# Patient Record
Sex: Male | Born: 1995 | Race: White | Hispanic: No | Marital: Single | Smoking: Current every day smoker
Health system: Southern US, Community
[De-identification: ages and names within clinical notes are randomized; demographics above are authoritative.]

---
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
## 2018-11-26 ENCOUNTER — Other Ambulatory Visit: Payer: Self-pay

## 2018-11-26 ENCOUNTER — Encounter: Payer: Self-pay | Admitting: Emergency Medicine

## 2018-11-26 ENCOUNTER — Ambulatory Visit
Admission: EM | Admit: 2018-11-26 | Discharge: 2018-11-26 | Disposition: A | Discharge status: Home / Self Care | Payer: BLUE CROSS/BLUE SHIELD | Attending: Internal Medicine | Admitting: Internal Medicine

## 2018-11-26 DIAGNOSIS — S20212A Contusion of left front wall of thorax, initial encounter: Secondary | ICD-10-CM

## 2018-11-26 DIAGNOSIS — T07XXXA Unspecified multiple injuries, initial encounter: Secondary | ICD-10-CM

## 2018-11-26 MED ORDER — IBUPROFEN 400 MG PO TABS: 800.0000 mg | ORAL_TABLET | Freq: Three times a day (TID) | ORAL | Status: AC | PRN
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
## 2021-09-22 DIAGNOSIS — F411 Generalized anxiety disorder: Secondary | ICD-10-CM | POA: Diagnosis not present

## 2021-09-28 DIAGNOSIS — F411 Generalized anxiety disorder: Secondary | ICD-10-CM | POA: Diagnosis not present

## 2021-10-05 DIAGNOSIS — F411 Generalized anxiety disorder: Secondary | ICD-10-CM | POA: Diagnosis not present

## 2021-10-12 DIAGNOSIS — F411 Generalized anxiety disorder: Secondary | ICD-10-CM | POA: Diagnosis not present

## 2021-10-19 DIAGNOSIS — F411 Generalized anxiety disorder: Secondary | ICD-10-CM | POA: Diagnosis not present

## 2021-10-26 DIAGNOSIS — F411 Generalized anxiety disorder: Secondary | ICD-10-CM | POA: Diagnosis not present

## 2021-11-10 DIAGNOSIS — F411 Generalized anxiety disorder: Secondary | ICD-10-CM | POA: Diagnosis not present

## 2021-11-23 DIAGNOSIS — F411 Generalized anxiety disorder: Secondary | ICD-10-CM | POA: Diagnosis not present

## 2021-11-30 DIAGNOSIS — F411 Generalized anxiety disorder: Secondary | ICD-10-CM | POA: Diagnosis not present

## 2021-12-07 DIAGNOSIS — F411 Generalized anxiety disorder: Secondary | ICD-10-CM | POA: Diagnosis not present

## 2021-12-14 DIAGNOSIS — F411 Generalized anxiety disorder: Secondary | ICD-10-CM | POA: Diagnosis not present

## 2021-12-21 DIAGNOSIS — F411 Generalized anxiety disorder: Secondary | ICD-10-CM | POA: Diagnosis not present

## 2021-12-28 DIAGNOSIS — F411 Generalized anxiety disorder: Secondary | ICD-10-CM | POA: Diagnosis not present

## 2022-01-04 DIAGNOSIS — F411 Generalized anxiety disorder: Secondary | ICD-10-CM | POA: Diagnosis not present

## 2022-01-11 DIAGNOSIS — F411 Generalized anxiety disorder: Secondary | ICD-10-CM | POA: Diagnosis not present

## 2022-01-18 DIAGNOSIS — F411 Generalized anxiety disorder: Secondary | ICD-10-CM | POA: Diagnosis not present

## 2022-02-01 DIAGNOSIS — F411 Generalized anxiety disorder: Secondary | ICD-10-CM | POA: Diagnosis not present

## 2022-02-08 DIAGNOSIS — F411 Generalized anxiety disorder: Secondary | ICD-10-CM | POA: Diagnosis not present

## 2022-02-15 DIAGNOSIS — F411 Generalized anxiety disorder: Secondary | ICD-10-CM | POA: Diagnosis not present

## 2022-02-22 DIAGNOSIS — F411 Generalized anxiety disorder: Secondary | ICD-10-CM | POA: Diagnosis not present

## 2022-03-01 DIAGNOSIS — F411 Generalized anxiety disorder: Secondary | ICD-10-CM | POA: Diagnosis not present

## 2022-03-08 DIAGNOSIS — F411 Generalized anxiety disorder: Secondary | ICD-10-CM | POA: Diagnosis not present

## 2022-03-15 DIAGNOSIS — F411 Generalized anxiety disorder: Secondary | ICD-10-CM | POA: Diagnosis not present

## 2022-03-22 DIAGNOSIS — F411 Generalized anxiety disorder: Secondary | ICD-10-CM | POA: Diagnosis not present

## 2022-03-29 DIAGNOSIS — F411 Generalized anxiety disorder: Secondary | ICD-10-CM | POA: Diagnosis not present

## 2022-04-05 DIAGNOSIS — F411 Generalized anxiety disorder: Secondary | ICD-10-CM | POA: Diagnosis not present

## 2022-04-12 DIAGNOSIS — F411 Generalized anxiety disorder: Secondary | ICD-10-CM | POA: Diagnosis not present

## 2022-04-19 DIAGNOSIS — F411 Generalized anxiety disorder: Secondary | ICD-10-CM | POA: Diagnosis not present

## 2022-04-26 DIAGNOSIS — F411 Generalized anxiety disorder: Secondary | ICD-10-CM | POA: Diagnosis not present

## 2022-05-03 DIAGNOSIS — F411 Generalized anxiety disorder: Secondary | ICD-10-CM | POA: Diagnosis not present

## 2022-05-11 DIAGNOSIS — F411 Generalized anxiety disorder: Secondary | ICD-10-CM | POA: Diagnosis not present

## 2022-05-17 DIAGNOSIS — F411 Generalized anxiety disorder: Secondary | ICD-10-CM | POA: Diagnosis not present

## 2022-05-24 DIAGNOSIS — F411 Generalized anxiety disorder: Secondary | ICD-10-CM | POA: Diagnosis not present

## 2022-05-31 DIAGNOSIS — F411 Generalized anxiety disorder: Secondary | ICD-10-CM | POA: Diagnosis not present

## 2022-06-07 DIAGNOSIS — F411 Generalized anxiety disorder: Secondary | ICD-10-CM | POA: Diagnosis not present

## 2022-06-14 DIAGNOSIS — F411 Generalized anxiety disorder: Secondary | ICD-10-CM | POA: Diagnosis not present

## 2022-06-21 DIAGNOSIS — F411 Generalized anxiety disorder: Secondary | ICD-10-CM | POA: Diagnosis not present

## 2022-06-28 DIAGNOSIS — F411 Generalized anxiety disorder: Secondary | ICD-10-CM | POA: Diagnosis not present

## 2022-07-05 DIAGNOSIS — F411 Generalized anxiety disorder: Secondary | ICD-10-CM | POA: Diagnosis not present

## 2022-07-12 DIAGNOSIS — F411 Generalized anxiety disorder: Secondary | ICD-10-CM | POA: Diagnosis not present

## 2022-07-19 DIAGNOSIS — F411 Generalized anxiety disorder: Secondary | ICD-10-CM | POA: Diagnosis not present

## 2022-07-26 DIAGNOSIS — F411 Generalized anxiety disorder: Secondary | ICD-10-CM | POA: Diagnosis not present

## 2022-08-02 DIAGNOSIS — F411 Generalized anxiety disorder: Secondary | ICD-10-CM | POA: Diagnosis not present

## 2022-08-09 DIAGNOSIS — F411 Generalized anxiety disorder: Secondary | ICD-10-CM | POA: Diagnosis not present

## 2022-08-16 DIAGNOSIS — F411 Generalized anxiety disorder: Secondary | ICD-10-CM | POA: Diagnosis not present

## 2022-08-23 DIAGNOSIS — F411 Generalized anxiety disorder: Secondary | ICD-10-CM | POA: Diagnosis not present

## 2022-08-30 DIAGNOSIS — F411 Generalized anxiety disorder: Secondary | ICD-10-CM | POA: Diagnosis not present

## 2022-09-08 ENCOUNTER — Encounter: Payer: Self-pay | Admitting: Nurse Practitioner

## 2022-09-08 ENCOUNTER — Ambulatory Visit: Payer: BC Managed Care – PPO | Admitting: Nurse Practitioner

## 2022-09-08 VITALS — BP 140/80 | HR 73 | Temp 98.1°F | Ht 66.0 in | Wt 130.4 lb

## 2022-09-08 DIAGNOSIS — Z23 Encounter for immunization: Secondary | ICD-10-CM

## 2022-09-08 DIAGNOSIS — Z0000 Encounter for general adult medical examination without abnormal findings: Secondary | ICD-10-CM

## 2022-09-08 DIAGNOSIS — Z13220 Encounter for screening for lipoid disorders: Secondary | ICD-10-CM

## 2022-09-08 DIAGNOSIS — K1379 Other lesions of oral mucosa: Secondary | ICD-10-CM | POA: Diagnosis not present

## 2022-09-08 LAB — CBC
HCT: 43.8 % (ref 39.0–52.0)
Hemoglobin: 14.6 g/dL (ref 13.0–17.0)
MCHC: 33.3 g/dL (ref 30.0–36.0)
MCV: 85.4 fl (ref 78.0–100.0)
Platelets: 235 10*3/uL (ref 150.0–400.0)
RBC: 5.12 Mil/uL (ref 4.22–5.81)
RDW: 12.8 % (ref 11.5–15.5)
WBC: 5.2 10*3/uL (ref 4.0–10.5)

## 2022-09-08 LAB — COMPREHENSIVE METABOLIC PANEL
ALT: 13 U/L (ref 0–53)
AST: 14 U/L (ref 0–37)
Albumin: 4.9 g/dL (ref 3.5–5.2)
Alkaline Phosphatase: 61 U/L (ref 39–117)
BUN: 15 mg/dL (ref 6–23)
CO2: 28 mEq/L (ref 19–32)
Calcium: 9.9 mg/dL (ref 8.4–10.5)
Chloride: 105 mEq/L (ref 96–112)
Creatinine, Ser: 0.93 mg/dL (ref 0.40–1.50)
GFR: 114.24 mL/min (ref >60.00)
Glucose, Bld: 102 mg/dL — ABNORMAL HIGH (ref 70–99)
Potassium: 4.2 mEq/L (ref 3.5–5.1)
Sodium: 141 mEq/L (ref 135–145)
Total Bilirubin: 0.8 mg/dL (ref 0.2–1.2)
Total Protein: 7.4 g/dL (ref 6.0–8.3)

## 2022-09-08 LAB — LIPID PANEL
Cholesterol: 124 mg/dL (ref 0–200)
HDL: 55.6 mg/dL (ref >39.00)
LDL Cholesterol: 58 mg/dL (ref 0–99)
NonHDL: 68.42
Total CHOL/HDL Ratio: 2
Triglycerides: 54 mg/dL (ref 0.0–149.0)
VLDL: 10.8 mg/dL (ref 0.0–40.0)

## 2022-09-08 LAB — TSH: TSH: 1.02 u[IU]/mL (ref 0.35–5.50)

## 2022-09-08 LAB — HEMOGLOBIN A1C: Hgb A1c MFr Bld: 5.4 % (ref 4.6–6.5)

## 2022-09-08 MED ORDER — NYSTATIN 100000 UNIT/ML MT SUSP: 5.0000 mL | Freq: Four times a day (QID) | OROMUCOSAL | 0 refills | Status: AC | PRN

## 2022-09-08 NOTE — Progress Notes (Signed)
New Patient Office Visit  Subjective    Patient ID: Gary Daugherty, male    DOB: 12-06-1995  Age: 27 y.o. MRN: [id number]  CC:  Chief Complaint  Patient presents with   Establish Care    Pt states that he has painful mouth ulcers that do not heal normally. Pt states it causes him to not be able to eat comfortably.  Lasts for weeks.     HPI [name] presents to establish care   Mouth ulcers: states that he has noticed it since college, over the past 4-5 years. States that he will get 4 at a time. States front of the inside of the lip or cheeks. States that he does not get them on the tongue. States they last 3 weeks up to 2 months. States that he use a oral numbing agent. States that he does have soe stress. Can hurt to breath or with the just being there.  for complete physical and follow up of chronic conditions.  Immunizations: -Tetanus: update today -Influenza: Completed this season -Shingles:  too young -Pneumonia: too young  -HPV UTd Covid 4 doses   Diet: Fair diet. States that he does 3 meals a day and some snacks. Water and hot tea Exercise: Hobbies   Eye exam: PRN Dental exam: Completes semi-annually    Colonoscopy: Too young, currently average risk Lung Cancer Screening: N/A  PSA: Too young, currently average risk   Sleep: states 10-130 and get up 530-545. States feels rested. Does not snore  Patient declined STI testing as he states he is not sexually active currently      Outpatient Encounter Medications as of 09/08/2022  Medication Sig   ibuprofen (ADVIL) 400 MG tablet Take 2 tablets (800 mg total) by mouth every 8 (eight) hours as needed.   magic mouthwash (nystatin, lidocaine, diphenhydrAMINE, alum & mag hydroxide) suspension Swish and spit 5 mLs 4 (four) times daily as needed for mouth pain.   Pediatric Multivit-Minerals-C (KIDS GUMMY BEAR VITAMINS PO) Take 1 tablet by mouth daily at 12 noon. (Patient not taking: Reported on 09/08/2022)    [DISCONTINUED] promethazine (PHENERGAN) 12.5 MG tablet Take 1 tablet (12.5 mg total) by mouth every 4 (four) hours as needed for nausea or vomiting.   No facility-administered encounter medications on file as of 09/08/2022.    No past medical history on file.  Past Surgical History:  Procedure Laterality Date   APPENDECTOMY  2013   KNEE ARTHROSCOPY WITH ANTERIOR CRUCIATE LIGAMENT (ACL) REPAIR WITH HAMSTRING GRAFT Right 02/04/2015   Procedure: KNEE ARTHROSCOPY WITH ANTERIOR CRUCIATE LIGAMENT (ACL) REconstruction WITH HAMSTRING GRAFT;  Surgeon: Juanell Fairly, MD;  Location: [facility];  Service: Orthopedics;  Laterality: Right;    Family History  Problem Relation Age of Onset   Hypertension Mother     Social History   Socioeconomic History   Marital status: Single    Spouse name: Not on file   Number of children: 0   Years of education: Not on file   Highest education level: Not on file  Occupational History   Not on file  Tobacco Use   Smoking status: Every Day    Types: E-cigarettes   Smokeless tobacco: Never  Vaping Use   Vaping status: Every Day   Substances: Nicotine, Flavoring  Substance and Sexual Activity   Alcohol use: Not Currently    Comment: 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  Respiratory:  Negative for shortness of breath.   Cardiovascular:  Negative for chest pain and leg swelling.   Gastrointestinal:  Negative for abdominal pain, blood in stool, constipation, diarrhea, nausea and vomiting.       BM daily  Genitourinary:  Negative for dysuria and hematuria.  Neurological:  Negative for tingling and headaches.  Psychiatric/Behavioral:  Negative for hallucinations and suicidal ideas.         Objective    BP (!) 140/80   Pulse 73   Temp 98.1 F (36.7 C) (Temporal)   Ht 5\' 6"  (1.676 m)   Wt 130 lb 6.4 oz (59.1 kg)   SpO2 98%   BMI 21.05 kg/m   Physical Exam Vitals and nursing note reviewed. Exam conducted with a chaperone present Melina Copa, CMA).  Constitutional:      Appearance: Normal appearance.  HENT:     Right Ear: Tympanic membrane, ear canal and external ear normal.     Left Ear: Tympanic membrane, ear canal and external ear normal.     Mouth/Throat:     Mouth: Mucous membranes are moist.     Pharynx: Oropharynx is clear.   Eyes:     Extraocular Movements: Extraocular movements intact.     Pupils: Pupils are equal, round, and reactive to light.  Cardiovascular:     Rate and Rhythm: Normal rate and regular rhythm.     Pulses: Normal pulses.     Heart sounds: Normal heart sounds.  Pulmonary:     Effort: Pulmonary effort is normal.     Breath sounds: Normal breath sounds.  Abdominal:     General: Bowel sounds are normal. There is no distension.     Palpations: There is no mass.     Tenderness: There is no abdominal tenderness.     Hernia: No hernia is present. There is no hernia in the left inguinal area.  Genitourinary:    Penis: Normal.      Testes: Normal.     Epididymis:     Right: Normal.     Left: Normal.  Musculoskeletal:     Right lower leg: No edema.     Left lower leg: No edema.  Lymphadenopathy:     Cervical: No cervical adenopathy.     Lower Body: No right inguinal adenopathy. No left inguinal adenopathy.  Skin:    General: Skin is warm.  Neurological:     General: No focal deficit present.     Mental Status: He  is alert.     Deep Tendon Reflexes:     Reflex Scores:      Bicep reflexes are 2+ on the right side and 2+ on the left side.      Patellar reflexes are 2+ on the right side and 2+ on the left side.    Comments: Bilateral upper and lower extremity strength 5/5  Psychiatric:        Mood and  Affect: Mood normal.        Behavior: Behavior normal.        Thought Content: Thought content normal.        Judgment: Judgment normal.         Assessment & Plan:   Problem List Items Addressed This Visit       Digestive   Mouth sore    Recurrent mouth ulcers/sores.  Been going on for years.  Will check HIV status along with diabetes status to make sure patient is not experiencing immunocompromising condition will treat with Magic mouthwash 5 mL 4 times daily as needed if this is ineffective consider triamcinolone paste       Relevant Medications   magic mouthwash (nystatin, lidocaine, diphenhydrAMINE, alum & mag hydroxide) suspension   Other Relevant Orders   HIV Antibody (routine testing w rflx)   Hemoglobin A1c     Other   Preventative health care - Primary    Discussed age-appropriate immunizations and screening exams.  Did review patient's personal, surgical, social, family histories.  Patient up-to-date on all age-appropriate vaccinations.  Update tetanus vaccine today.  Patient is too young for CRC screening or prostate cancer screening.  Patient was given information at discharge about preventative healthcare maintenance with anticipatory guidance.      Relevant Orders   CBC   Comprehensive metabolic panel   TSH   Other Visit Diagnoses     Screening for lipid disorders       Relevant Orders   Lipid panel   Need for diphtheria-tetanus-pertussis (Tdap) vaccine       Relevant Orders   Tdap vaccine greater than or equal to 7yo IM       Return in about 1 year (around 09/08/2023).   Audria Nine, NP

## 2022-09-08 NOTE — Patient Instructions (Signed)
Nice to see you today I will be in touch with the labs once I have them Follow up with me in 1 year, sooner if you need me If the sores do not heal with the mouth wash let me know and we can try the paste

## 2022-09-08 NOTE — Assessment & Plan Note (Signed)
Discussed age-appropriate immunizations and screening exams.  Did review patient's personal, surgical, social, family histories.  Patient up-to-date on all age-appropriate vaccinations.  Update tetanus vaccine today.  Patient is too young for CRC screening or prostate cancer screening.  Patient was given information at discharge about preventative healthcare maintenance with anticipatory guidance.

## 2022-09-08 NOTE — Assessment & Plan Note (Signed)
Recurrent mouth ulcers/sores.  Been going on for years.  Will check HIV status along with diabetes status to make sure patient is not experiencing immunocompromising condition will treat with Magic mouthwash 5 mL 4 times daily as needed if this is ineffective consider triamcinolone paste

## 2022-09-13 DIAGNOSIS — F411 Generalized anxiety disorder: Secondary | ICD-10-CM | POA: Diagnosis not present

## 2022-09-27 DIAGNOSIS — F411 Generalized anxiety disorder: Secondary | ICD-10-CM | POA: Diagnosis not present

## 2022-10-04 DIAGNOSIS — F411 Generalized anxiety disorder: Secondary | ICD-10-CM | POA: Diagnosis not present

## 2022-10-11 DIAGNOSIS — F411 Generalized anxiety disorder: Secondary | ICD-10-CM | POA: Diagnosis not present

## 2022-10-18 DIAGNOSIS — F411 Generalized anxiety disorder: Secondary | ICD-10-CM | POA: Diagnosis not present

## 2022-10-25 DIAGNOSIS — F411 Generalized anxiety disorder: Secondary | ICD-10-CM | POA: Diagnosis not present

## 2022-11-01 DIAGNOSIS — F411 Generalized anxiety disorder: Secondary | ICD-10-CM | POA: Diagnosis not present

## 2022-11-08 DIAGNOSIS — F411 Generalized anxiety disorder: Secondary | ICD-10-CM | POA: Diagnosis not present

## 2022-11-15 DIAGNOSIS — F411 Generalized anxiety disorder: Secondary | ICD-10-CM | POA: Diagnosis not present

## 2022-11-22 DIAGNOSIS — F411 Generalized anxiety disorder: Secondary | ICD-10-CM | POA: Diagnosis not present

## 2022-11-29 DIAGNOSIS — F411 Generalized anxiety disorder: Secondary | ICD-10-CM | POA: Diagnosis not present

## 2022-12-13 DIAGNOSIS — F411 Generalized anxiety disorder: Secondary | ICD-10-CM | POA: Diagnosis not present

## 2022-12-20 DIAGNOSIS — F411 Generalized anxiety disorder: Secondary | ICD-10-CM | POA: Diagnosis not present

## 2023-01-03 DIAGNOSIS — F411 Generalized anxiety disorder: Secondary | ICD-10-CM | POA: Diagnosis not present

## 2023-01-17 DIAGNOSIS — F411 Generalized anxiety disorder: Secondary | ICD-10-CM | POA: Diagnosis not present

## 2023-02-07 DIAGNOSIS — F411 Generalized anxiety disorder: Secondary | ICD-10-CM | POA: Diagnosis not present

## 2023-02-14 DIAGNOSIS — F411 Generalized anxiety disorder: Secondary | ICD-10-CM | POA: Diagnosis not present

## 2023-02-28 DIAGNOSIS — F411 Generalized anxiety disorder: Secondary | ICD-10-CM | POA: Diagnosis not present

## 2023-03-07 DIAGNOSIS — F411 Generalized anxiety disorder: Secondary | ICD-10-CM | POA: Diagnosis not present

## 2023-03-14 DIAGNOSIS — F411 Generalized anxiety disorder: Secondary | ICD-10-CM | POA: Diagnosis not present

## 2023-03-21 DIAGNOSIS — F411 Generalized anxiety disorder: Secondary | ICD-10-CM | POA: Diagnosis not present

## 2023-03-28 DIAGNOSIS — F411 Generalized anxiety disorder: Secondary | ICD-10-CM | POA: Diagnosis not present

## 2023-05-02 DIAGNOSIS — F411 Generalized anxiety disorder: Secondary | ICD-10-CM | POA: Diagnosis not present

## 2023-05-16 DIAGNOSIS — F411 Generalized anxiety disorder: Secondary | ICD-10-CM | POA: Diagnosis not present

## 2023-05-23 DIAGNOSIS — F411 Generalized anxiety disorder: Secondary | ICD-10-CM | POA: Diagnosis not present

## 2023-05-30 DIAGNOSIS — F411 Generalized anxiety disorder: Secondary | ICD-10-CM | POA: Diagnosis not present

## 2023-06-13 DIAGNOSIS — F411 Generalized anxiety disorder: Secondary | ICD-10-CM | POA: Diagnosis not present

## 2023-07-04 DIAGNOSIS — F411 Generalized anxiety disorder: Secondary | ICD-10-CM | POA: Diagnosis not present

## 2023-07-11 DIAGNOSIS — F411 Generalized anxiety disorder: Secondary | ICD-10-CM | POA: Diagnosis not present

## 2023-07-18 DIAGNOSIS — F411 Generalized anxiety disorder: Secondary | ICD-10-CM | POA: Diagnosis not present

## 2023-07-25 DIAGNOSIS — F411 Generalized anxiety disorder: Secondary | ICD-10-CM | POA: Diagnosis not present

## 2023-08-08 DIAGNOSIS — F411 Generalized anxiety disorder: Secondary | ICD-10-CM | POA: Diagnosis not present

## 2023-08-22 DIAGNOSIS — F411 Generalized anxiety disorder: Secondary | ICD-10-CM | POA: Diagnosis not present

## 2023-08-29 DIAGNOSIS — F411 Generalized anxiety disorder: Secondary | ICD-10-CM | POA: Diagnosis not present

## 2023-09-12 DIAGNOSIS — F411 Generalized anxiety disorder: Secondary | ICD-10-CM | POA: Diagnosis not present

## 2023-09-19 DIAGNOSIS — F411 Generalized anxiety disorder: Secondary | ICD-10-CM | POA: Diagnosis not present

## 2023-09-26 DIAGNOSIS — F411 Generalized anxiety disorder: Secondary | ICD-10-CM | POA: Diagnosis not present
